# Patient Record
Sex: Female | Born: 1950 | Race: White | Hispanic: No | Marital: Married | Smoking: Never smoker
Health system: Southern US, Community
[De-identification: ages and names within clinical notes are randomized; demographics above are authoritative.]

## PROBLEM LIST (undated history)

## (undated) DIAGNOSIS — I10 Essential (primary) hypertension: Secondary | ICD-10-CM

## (undated) DIAGNOSIS — F419 Anxiety disorder, unspecified: Secondary | ICD-10-CM

## (undated) DIAGNOSIS — E785 Hyperlipidemia, unspecified: Secondary | ICD-10-CM

## (undated) DIAGNOSIS — E119 Type 2 diabetes mellitus without complications: Secondary | ICD-10-CM

## (undated) HISTORY — PX: ABDOMINAL HYSTERECTOMY: SHX81

## (undated) HISTORY — PX: DILATION AND CURETTAGE OF UTERUS: SHX78

## (undated) HISTORY — PX: COLONOSCOPY: SHX174

---

## 2004-06-11 ENCOUNTER — Ambulatory Visit: Payer: Self-pay | Admitting: Family Medicine

## 2005-07-28 ENCOUNTER — Ambulatory Visit: Payer: Self-pay | Admitting: Family Medicine

## 2005-08-03 ENCOUNTER — Ambulatory Visit: Payer: Self-pay | Admitting: Family Medicine

## 2006-10-15 ENCOUNTER — Ambulatory Visit: Payer: Self-pay | Admitting: Gastroenterology

## 2007-02-03 ENCOUNTER — Ambulatory Visit: Payer: Self-pay | Admitting: Family Medicine

## 2008-02-07 ENCOUNTER — Ambulatory Visit: Payer: Self-pay | Admitting: Family Medicine

## 2009-01-02 ENCOUNTER — Ambulatory Visit: Payer: Self-pay | Admitting: Family Medicine

## 2009-02-13 ENCOUNTER — Ambulatory Visit: Payer: Self-pay | Admitting: Family Medicine

## 2011-04-08 ENCOUNTER — Ambulatory Visit: Payer: Self-pay | Admitting: Family Medicine

## 2012-04-13 ENCOUNTER — Ambulatory Visit: Payer: Self-pay | Admitting: Family Medicine

## 2012-05-02 ENCOUNTER — Ambulatory Visit: Payer: Self-pay | Admitting: Family Medicine

## 2013-08-22 ENCOUNTER — Ambulatory Visit: Payer: Self-pay | Admitting: Family Medicine

## 2014-11-08 ENCOUNTER — Other Ambulatory Visit: Payer: Self-pay | Admitting: Family Medicine

## 2014-11-08 DIAGNOSIS — Z1231 Encounter for screening mammogram for malignant neoplasm of breast: Secondary | ICD-10-CM

## 2014-11-14 ENCOUNTER — Ambulatory Visit
Admission: RE | Admit: 2014-11-14 | Discharge: 2014-11-14 | Disposition: A | Discharge status: Home / Self Care | Payer: BC Managed Care – PPO | Source: Ambulatory Visit | Attending: Family Medicine | Admitting: Family Medicine

## 2014-11-14 DIAGNOSIS — Z1231 Encounter for screening mammogram for malignant neoplasm of breast: Secondary | ICD-10-CM

## 2015-12-12 ENCOUNTER — Other Ambulatory Visit: Payer: Self-pay | Admitting: Family Medicine

## 2015-12-12 DIAGNOSIS — Z1239 Encounter for other screening for malignant neoplasm of breast: Secondary | ICD-10-CM

## 2016-01-02 ENCOUNTER — Ambulatory Visit: Payer: BC Managed Care – PPO

## 2016-01-14 ENCOUNTER — Ambulatory Visit: Payer: BC Managed Care – PPO

## 2016-02-11 ENCOUNTER — Ambulatory Visit
Admission: RE | Admit: 2016-02-11 | Discharge: 2016-02-11 | Disposition: A | Discharge status: Home / Self Care | Payer: BC Managed Care – PPO | Source: Ambulatory Visit | Attending: Family Medicine | Admitting: Family Medicine

## 2016-02-11 ENCOUNTER — Other Ambulatory Visit: Payer: Self-pay | Admitting: Family Medicine

## 2016-02-11 DIAGNOSIS — Z1231 Encounter for screening mammogram for malignant neoplasm of breast: Secondary | ICD-10-CM | POA: Insufficient documentation

## 2016-02-11 DIAGNOSIS — Z1239 Encounter for other screening for malignant neoplasm of breast: Secondary | ICD-10-CM

## 2017-01-13 ENCOUNTER — Other Ambulatory Visit: Payer: Self-pay | Admitting: Family Medicine

## 2017-01-13 DIAGNOSIS — Z1231 Encounter for screening mammogram for malignant neoplasm of breast: Secondary | ICD-10-CM

## 2017-02-16 ENCOUNTER — Ambulatory Visit
Admission: RE | Admit: 2017-02-16 | Discharge: 2017-02-16 | Disposition: A | Discharge status: Home / Self Care | Payer: BC Managed Care – PPO | Source: Ambulatory Visit | Attending: Family Medicine | Admitting: Family Medicine

## 2017-02-16 DIAGNOSIS — Z1231 Encounter for screening mammogram for malignant neoplasm of breast: Secondary | ICD-10-CM | POA: Diagnosis not present

## 2018-02-16 ENCOUNTER — Other Ambulatory Visit: Payer: Self-pay | Admitting: Family Medicine

## 2018-02-16 DIAGNOSIS — Z1231 Encounter for screening mammogram for malignant neoplasm of breast: Secondary | ICD-10-CM

## 2018-02-22 ENCOUNTER — Ambulatory Visit
Admission: RE | Admit: 2018-02-22 | Discharge: 2018-02-22 | Disposition: A | Discharge status: Home / Self Care | Payer: Medicare Other | Source: Ambulatory Visit | Attending: Family Medicine | Admitting: Family Medicine

## 2018-02-22 DIAGNOSIS — Z1231 Encounter for screening mammogram for malignant neoplasm of breast: Secondary | ICD-10-CM

## 2019-04-03 ENCOUNTER — Other Ambulatory Visit: Payer: Self-pay | Admitting: Family Medicine

## 2019-04-03 DIAGNOSIS — Z1231 Encounter for screening mammogram for malignant neoplasm of breast: Secondary | ICD-10-CM

## 2019-09-19 ENCOUNTER — Other Ambulatory Visit
Admission: RE | Admit: 2019-09-19 | Discharge: 2019-09-19 | Disposition: A | Discharge status: Home / Self Care | Payer: Medicare PPO | Source: Ambulatory Visit | Attending: Internal Medicine | Admitting: Internal Medicine

## 2019-09-19 DIAGNOSIS — Z20822 Contact with and (suspected) exposure to covid-19: Secondary | ICD-10-CM | POA: Insufficient documentation

## 2019-09-19 DIAGNOSIS — Z01812 Encounter for preprocedural laboratory examination: Secondary | ICD-10-CM | POA: Diagnosis present

## 2019-09-19 LAB — SARS CORONAVIRUS 2 (TAT 6-24 HRS): SARS Coronavirus 2: NEGATIVE

## 2019-09-20 ENCOUNTER — Encounter: Payer: Self-pay | Admitting: Internal Medicine

## 2019-09-21 ENCOUNTER — Ambulatory Visit
Admission: RE | Admit: 2019-09-21 | Discharge: 2019-09-21 | Disposition: A | Discharge status: Home / Self Care | Payer: Medicare PPO | Attending: Internal Medicine | Admitting: Internal Medicine

## 2019-09-21 ENCOUNTER — Encounter
Admission: RE | Disposition: A | Discharge status: Home / Self Care | Payer: Self-pay | Source: Home / Self Care | Attending: Internal Medicine

## 2019-09-21 ENCOUNTER — Ambulatory Visit: Payer: Medicare PPO | Admitting: Anesthesiology

## 2019-09-21 ENCOUNTER — Encounter: Payer: Self-pay | Admitting: Internal Medicine

## 2019-09-21 ENCOUNTER — Other Ambulatory Visit: Payer: Self-pay

## 2019-09-21 DIAGNOSIS — Z79899 Other long term (current) drug therapy: Secondary | ICD-10-CM | POA: Diagnosis not present

## 2019-09-21 DIAGNOSIS — Z1211 Encounter for screening for malignant neoplasm of colon: Secondary | ICD-10-CM | POA: Insufficient documentation

## 2019-09-21 DIAGNOSIS — E785 Hyperlipidemia, unspecified: Secondary | ICD-10-CM | POA: Diagnosis not present

## 2019-09-21 DIAGNOSIS — I10 Essential (primary) hypertension: Secondary | ICD-10-CM | POA: Insufficient documentation

## 2019-09-21 DIAGNOSIS — Z800 Family history of malignant neoplasm of digestive organs: Secondary | ICD-10-CM | POA: Insufficient documentation

## 2019-09-21 DIAGNOSIS — F419 Anxiety disorder, unspecified: Secondary | ICD-10-CM | POA: Insufficient documentation

## 2019-09-21 DIAGNOSIS — E119 Type 2 diabetes mellitus without complications: Secondary | ICD-10-CM | POA: Insufficient documentation

## 2019-09-21 DIAGNOSIS — K5730 Diverticulosis of large intestine without perforation or abscess without bleeding: Secondary | ICD-10-CM | POA: Diagnosis not present

## 2019-09-21 DIAGNOSIS — Z7984 Long term (current) use of oral hypoglycemic drugs: Secondary | ICD-10-CM | POA: Insufficient documentation

## 2019-09-21 DIAGNOSIS — Z888 Allergy status to other drugs, medicaments and biological substances status: Secondary | ICD-10-CM | POA: Insufficient documentation

## 2019-09-21 DIAGNOSIS — Z7982 Long term (current) use of aspirin: Secondary | ICD-10-CM | POA: Diagnosis not present

## 2019-09-21 HISTORY — DX: Essential (primary) hypertension: I10

## 2019-09-21 HISTORY — PX: COLONOSCOPY WITH PROPOFOL: SHX5780

## 2019-09-21 HISTORY — DX: Type 2 diabetes mellitus without complications: E11.9

## 2019-09-21 HISTORY — DX: Hyperlipidemia, unspecified: E78.5

## 2019-09-21 HISTORY — DX: Anxiety disorder, unspecified: F41.9

## 2019-09-21 LAB — GLUCOSE, CAPILLARY: Glucose-Capillary: 132 mg/dL — ABNORMAL HIGH (ref 70–99)

## 2019-09-21 SURGERY — COLONOSCOPY WITH PROPOFOL
Anesthesia: General

## 2019-09-21 MED ORDER — LIDOCAINE HCL (CARDIAC) PF 100 MG/5ML IV SOSY
PREFILLED_SYRINGE | INTRAVENOUS | Status: DC | PRN
  Administered 2019-09-21: 40 mg via INTRAVENOUS

## 2019-09-21 MED ORDER — MIDAZOLAM HCL 2 MG/2ML IJ SOLN
INTRAMUSCULAR | Status: AC
  Filled 2019-09-21: qty 2

## 2019-09-21 MED ORDER — PROPOFOL 500 MG/50ML IV EMUL
INTRAVENOUS | Status: DC | PRN
  Administered 2019-09-21: 150 ug/kg/min via INTRAVENOUS

## 2019-09-21 MED ORDER — MIDAZOLAM HCL 2 MG/2ML IJ SOLN
INTRAMUSCULAR | Status: DC | PRN
  Administered 2019-09-21: 2 mg via INTRAVENOUS

## 2019-09-21 MED ORDER — EPHEDRINE 5 MG/ML INJ
INTRAVENOUS | Status: AC
  Filled 2019-09-21: qty 10

## 2019-09-21 MED ORDER — PROPOFOL 10 MG/ML IV BOLUS
INTRAVENOUS | Status: DC | PRN
  Administered 2019-09-21: 80 mg via INTRAVENOUS
  Administered 2019-09-21: 40 mg via INTRAVENOUS

## 2019-09-21 MED ORDER — EPHEDRINE SULFATE 50 MG/ML IJ SOLN
INTRAMUSCULAR | Status: DC | PRN
  Administered 2019-09-21: 10 mg via INTRAVENOUS

## 2019-09-21 MED ORDER — PROPOFOL 500 MG/50ML IV EMUL
INTRAVENOUS | Status: AC
  Filled 2019-09-21: qty 50

## 2019-09-21 MED ORDER — SODIUM CHLORIDE 0.9 % IV SOLN: INTRAVENOUS | Status: DC

## 2019-09-21 NOTE — Anesthesia Preprocedure Evaluation (Signed)
Anesthesia Evaluation  Patient identified by MRN, date of birth, ID band Patient awake    Reviewed: Allergy & Precautions, NPO status , Patient's Chart, lab work & pertinent test results  Airway Mallampati: III       Dental   Pulmonary neg pulmonary ROS,           Cardiovascular hypertension,      Neuro/Psych Anxiety negative neurological ROS     GI/Hepatic Neg liver ROS,   Endo/Other  diabetes  Renal/GU negative Renal ROS  negative genitourinary   Musculoskeletal negative musculoskeletal ROS (+)   Abdominal   Peds negative pediatric ROS (+)  Hematology negative hematology ROS (+)   Anesthesia Other Findings Past Medical History: No date: Anxiety No date: Diabetes mellitus without complication (HCC) No date: Hyperlipidemia No date: Hypertension  Reproductive/Obstetrics                             Anesthesia Physical Anesthesia Plan  ASA: II  Anesthesia Plan: General   Post-op Pain Management:    Induction: Intravenous  PONV Risk Score and Plan: Propofol infusion  Airway Management Planned: Nasal Cannula  Additional Equipment:   Intra-op Plan:   Post-operative Plan:   Informed Consent: I have reviewed the patients History and Physical, chart, labs and discussed the procedure including the risks, benefits and alternatives for the proposed anesthesia with the patient or authorized representative who has indicated his/her understanding and acceptance.     Dental advisory given  Plan Discussed with: CRNA and Surgeon  Anesthesia Plan Comments:         Anesthesia Quick Evaluation

## 2019-09-21 NOTE — Anesthesia Procedure Notes (Signed)
Performed by: Alencia Gordon, CRNA Pre-anesthesia Checklist: Patient identified, Emergency Drugs available, Suction available and Patient being monitored Patient Re-evaluated:Patient Re-evaluated prior to induction Oxygen Delivery Method: Nasal cannula Induction Type: IV induction Dental Injury: Teeth and Oropharynx as per pre-operative assessment  Comments: Nasal cannula with etCO2 monitoring       

## 2019-09-21 NOTE — Transfer of Care (Signed)
Immediate Anesthesia Transfer of Care Note  Patient: Victoria Bauer  Procedure(s) Performed: Procedure(s): COLONOSCOPY WITH PROPOFOL (N/A)  Patient Location: PACU and Endoscopy Unit  Anesthesia Type:General  Level of Consciousness: sedated  Airway & Oxygen Therapy: Patient Spontanous Breathing and Patient connected to nasal cannula oxygen  Post-op Assessment: Report given to RN and Post -op Vital signs reviewed and stable  Post vital signs: Reviewed and stable  Last Vitals:  Vitals:   09/21/19 1000 09/21/19 1007  BP:  (!) 89/47  Pulse:  65  Resp:  (!) 22  Temp: (!) (P) 35.8 C   SpO2:  96%    Complications: No apparent anesthesia complications

## 2019-09-21 NOTE — Op Note (Signed)
[facility] Patient Name: Victoria Bauer Procedure Date: 09/21/2019 9:35 AM MRN: [id number] Account #: [id number] Date of Birth: 1950/07/06 Admit Type: Outpatient Age: 69 Room: [facility] ENDO ROOM 3 Gender: Female Note Status: Finalized Procedure:             Colonoscopy Indications:           Screening patient at increased risk: Family history of                         1st-degree relative with colorectal cancer at age 25                         years (or older) Providers:             Binnie Vonderhaar K. Norma Fredrickson MD, MD Referring MD:          Teena Irani. Terance Hart, MD (Referring MD) Medicines:             Propofol per Anesthesia Complications:         No immediate complications. Procedure:             Pre-Anesthesia Assessment:                        - The risks and benefits of the procedure and the                         sedation options and risks were discussed with the                         patient. All questions were answered and informed                         consent was obtained.                        - Patient identification and proposed procedure were                         verified prior to the procedure by the nurse. The                         procedure was verified in the procedure room.                        - ASA Grade Assessment: III - A patient with severe                         systemic disease.                        - After reviewing the risks and benefits, the patient                         was deemed in satisfactory condition to undergo the                         procedure.                        After obtaining informed consent,  the colonoscope was                         passed under direct vision. Throughout the procedure,                         the patient's blood pressure, pulse, and oxygen                         saturations were monitored continuously. The                         Colonoscope was introduced through the anus and                          advanced to the the cecum, identified by appendiceal                         orifice and ileocecal valve. The colonoscopy was                         performed without difficulty. The patient tolerated                         the procedure well. The quality of the bowel                         preparation was good. Findings:      The perianal and digital rectal examinations were normal. Pertinent       negatives include normal sphincter tone and no palpable rectal lesions.      A few small-mouthed diverticula were found in the sigmoid colon.      The exam was otherwise without abnormality. Impression:            - Diverticulosis in the sigmoid colon.                        - The examination was otherwise normal.                        - No specimens collected. Recommendation:        - Patient has a contact number available for                         emergencies. The signs and symptoms of potential                         delayed complications were discussed with the patient.                         Return to normal activities tomorrow. Written                         discharge instructions were provided to the patient.                        - Resume previous diet.                        - Continue  present medications.                        - Repeat colonoscopy in 5 years for screening purposes.                        - Return to GI office PRN. Procedure Code(s):     --- Professional ---                        U1324, Colorectal cancer screening; colonoscopy on                         individual at high risk Diagnosis Code(s):     --- Professional ---                        K57.30, Diverticulosis of large intestine without                         perforation or abscess without bleeding                        Z80.0, Family history of malignant neoplasm of                         digestive organs CPT copyright 2019 American Medical Association. All rights  reserved. The codes documented in this report are preliminary and upon coder review may  be revised to meet current compliance requirements. Stanton Kidney MD, MD 09/21/2019 10:00:53 AM This report has been signed electronically. Number of Addenda: 0 Note Initiated On: 09/21/2019 9:35 AM Scope Withdrawal Time: 0 hours 6 minutes 28 seconds  Total Procedure Duration: 0 hours 11 minutes 3 seconds  Estimated Blood Loss:  Estimated blood loss: none.      [facility]

## 2019-09-21 NOTE — H&P (Signed)
Outpatient short stay form Pre-procedure 09/21/2019 9:33 AM Ordell Prichett K. Norma Fredrickson, M.D.  Primary Physician: Dorothey Baseman, M.D.  Reason for visit:  Family history of colon cancer  History of present illness:  69 y/o female presents for a family history of colon cancer. Patient denies change in bowel habits, rectal bleeding, weight loss or abdominal pain.      Current Facility-Administered Medications:  .  0.9 %  sodium chloride infusion, , Intravenous, Continuous, [place], Boykin Nearing, MD, Last Rate: 20 mL/hr at 09/21/19 0842, New Bag at 09/21/19 0842  Medications Prior to Admission  Medication Sig Dispense Refill Last Dose  . amLODipine-benazepril (LOTREL) 5-20 MG capsule Take 1 capsule by mouth 2 (two) times daily.   09/21/2019 at 0600  . aspirin EC 81 MG tablet Take 81 mg by mouth daily.   Past Week at Unknown time  . diazepam (VALIUM) 5 MG tablet Take 5 mg by mouth every 6 (six) hours as needed for anxiety.   Past Week at Unknown time  . Dulaglutide 0.75 MG/0.5ML SOPN Inject into the skin. Inject subcutaneously weekly   Past Week at Unknown time  . ezetimibe-simvastatin (VYTORIN) 10-40 MG tablet Take 1 tablet by mouth at bedtime.   Past Week at Unknown time  . fexofenadine (ALLEGRA) 180 MG tablet Take 180 mg by mouth daily.   Past Week at Unknown time  . fluticasone (FLONASE) 50 MCG/ACT nasal spray Place into both nostrils daily.   Past Week at Unknown time  . glimepiride (AMARYL) 2 MG tablet Take 2 mg by mouth daily with breakfast.   Past Week at Unknown time  . hydrochlorothiazide (HYDRODIURIL) 25 MG tablet Take 25 mg by mouth daily.   09/20/2019 at 0800  . linagliptin (TRADJENTA) 5 MG TABS tablet Take 5 mg by mouth daily.   Past Week at Unknown time  . metFORMIN (GLUCOPHAGE) 500 MG tablet Take by mouth 2 (two) times daily with a meal.   Past Week at Unknown time  . Multiple Vitamin (MULTIVITAMIN) tablet Take 1 tablet by mouth daily.   Past Week at Unknown time  . psyllium (REGULOID) 0.52  g capsule Take 0.52 g by mouth daily.   Past Week at Unknown time     Allergies  Allergen Reactions  . Beta Adrenergic Blockers      Past Medical History:  Diagnosis Date  . Anxiety   . Diabetes mellitus without complication (HCC)   . Hyperlipidemia   . Hypertension     Review of systems:  Otherwise negative.    Physical Exam  Gen: Alert, oriented. Appears stated age.  HEENT: [place]/AT. PERRLA. Lungs: CTA, no wheezes. CV: RR nl S1, S2. Abd: soft, benign, no masses. BS+ Ext: No edema. Pulses 2+    Planned procedures: Proceed with colonoscopy. The patient understands the nature of the planned procedure, indications, risks, alternatives and potential complications including but not limited to bleeding, infection, perforation, damage to internal organs and possible oversedation/side effects from anesthesia. The patient agrees and gives consent to proceed.  Please refer to procedure notes for findings, recommendations and patient disposition/instructions.     Shylo Zamor K. Norma Fredrickson, M.D. Gastroenterology 09/21/2019  9:33 AM

## 2019-09-21 NOTE — Interval H&P Note (Signed)
History and Physical Interval Note:  09/21/2019 9:34 AM  Victoria Bauer  has presented today for surgery, with the diagnosis of fhcc change in bowels.  The various methods of treatment have been discussed with the patient and family. After consideration of risks, benefits and other options for treatment, the patient has consented to  Procedure(s): COLONOSCOPY WITH PROPOFOL (N/A) as a surgical intervention.  The patient's history has been reviewed, patient examined, no change in status, stable for surgery.  I have reviewed the patient's chart and labs.  Questions were answered to the patient's satisfaction.     [name], [name]

## 2019-09-21 NOTE — Anesthesia Postprocedure Evaluation (Signed)
Anesthesia Post Note  Patient: Victoria Bauer  Procedure(s) Performed: COLONOSCOPY WITH PROPOFOL (N/A )  Patient location during evaluation: Endoscopy Anesthesia Type: General Level of consciousness: awake and alert and oriented Pain management: pain level controlled Vital Signs Assessment: post-procedure vital signs reviewed and stable Respiratory status: spontaneous breathing Cardiovascular status: blood pressure returned to baseline Anesthetic complications: no     Last Vitals:  Vitals:   09/21/19 1050 09/21/19 1100  BP: (!) 105/50 (!) 103/51  Pulse:    Resp:    Temp:    SpO2:      Last Pain:  Vitals:   09/21/19 1028  TempSrc:   PainSc: 0-No pain                 Artie Takayama

## 2019-09-21 NOTE — OR Nursing (Signed)
Systolic bp unstable. Trending in 90s. rn spoke with dr Noralyn Pick who ordered 250cc bolus of ns now.

## 2019-09-22 ENCOUNTER — Encounter: Payer: Self-pay | Admitting: *Deleted

## 2019-09-25 ENCOUNTER — Ambulatory Visit
Admission: RE | Admit: 2019-09-25 | Discharge: 2019-09-25 | Disposition: A | Discharge status: Home / Self Care | Payer: Medicare PPO | Source: Ambulatory Visit | Attending: Family Medicine | Admitting: Family Medicine

## 2019-09-25 DIAGNOSIS — Z1231 Encounter for screening mammogram for malignant neoplasm of breast: Secondary | ICD-10-CM | POA: Diagnosis not present

## 2021-07-12 IMAGING — MG DIGITAL SCREENING BILAT W/ TOMO W/ CAD
6 of 10 series · 6 of 30 positions shown · non-contrast
Comparison: Previous exam(s).

CLINICAL DATA: Screening.

EXAM:
DIGITAL SCREENING BILATERAL MAMMOGRAM WITH TOMO AND CAD

[R MLO synth-2D (1 of 2)]
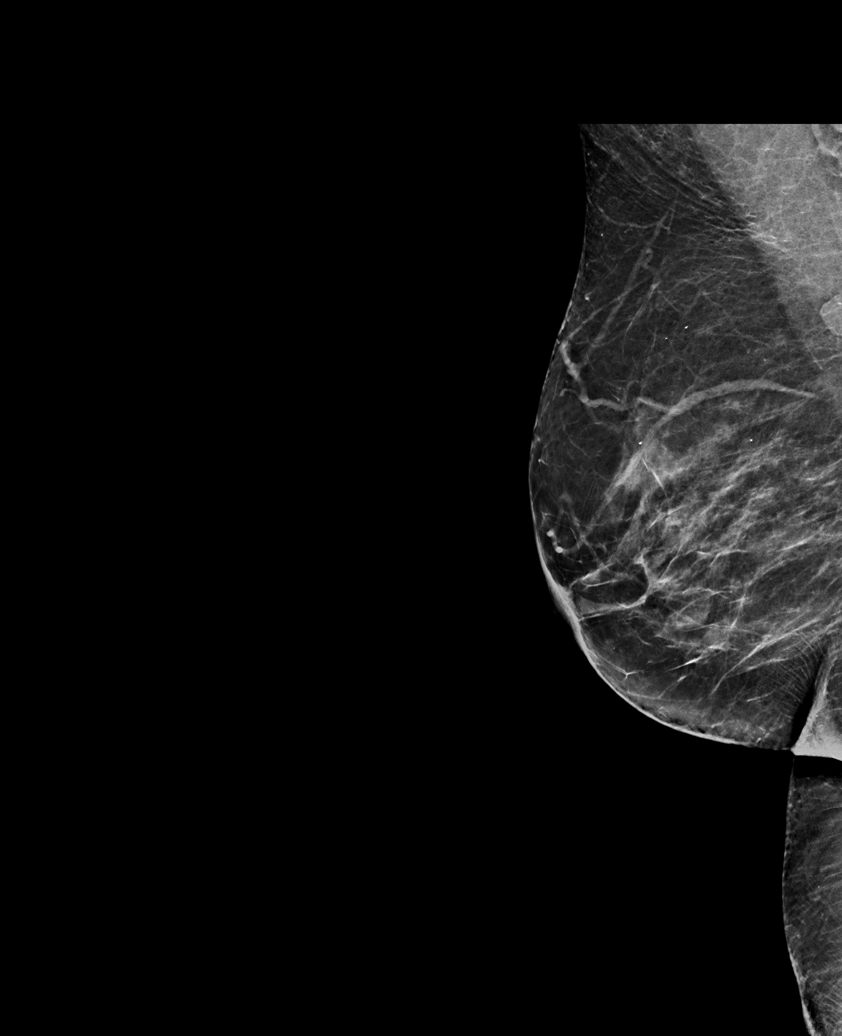

[R MLO synth-2D (2 of 2)]
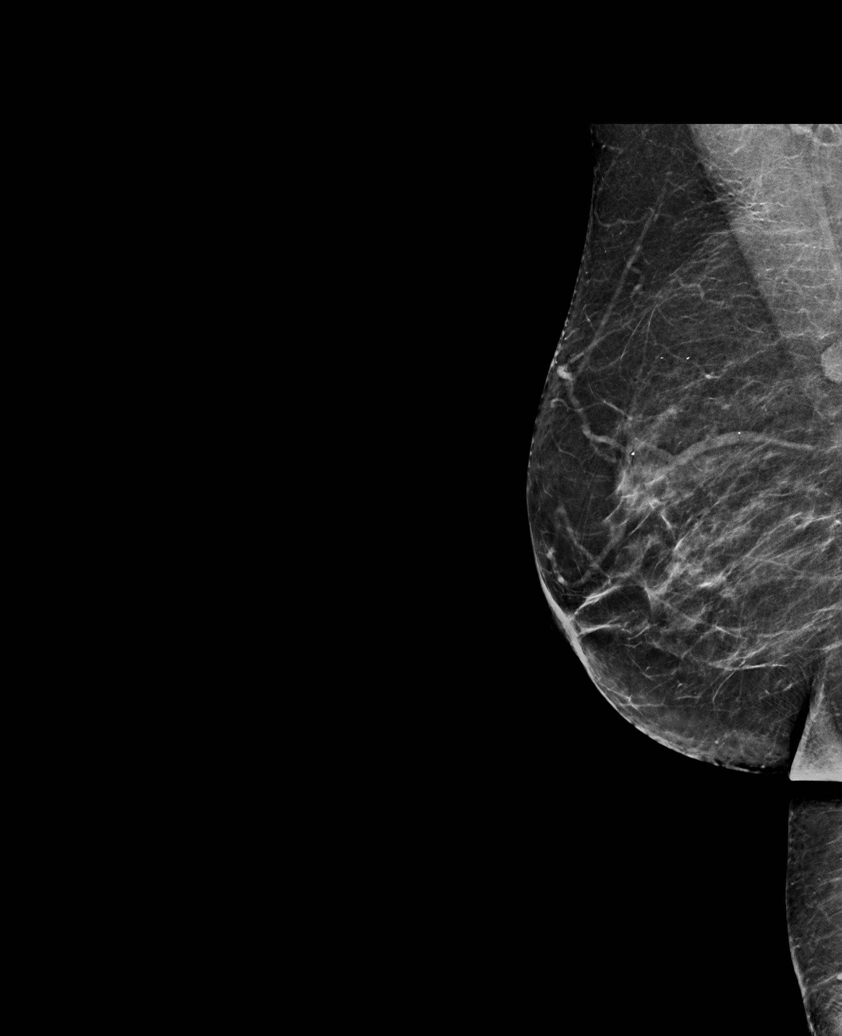

[L MLO synth-2D]
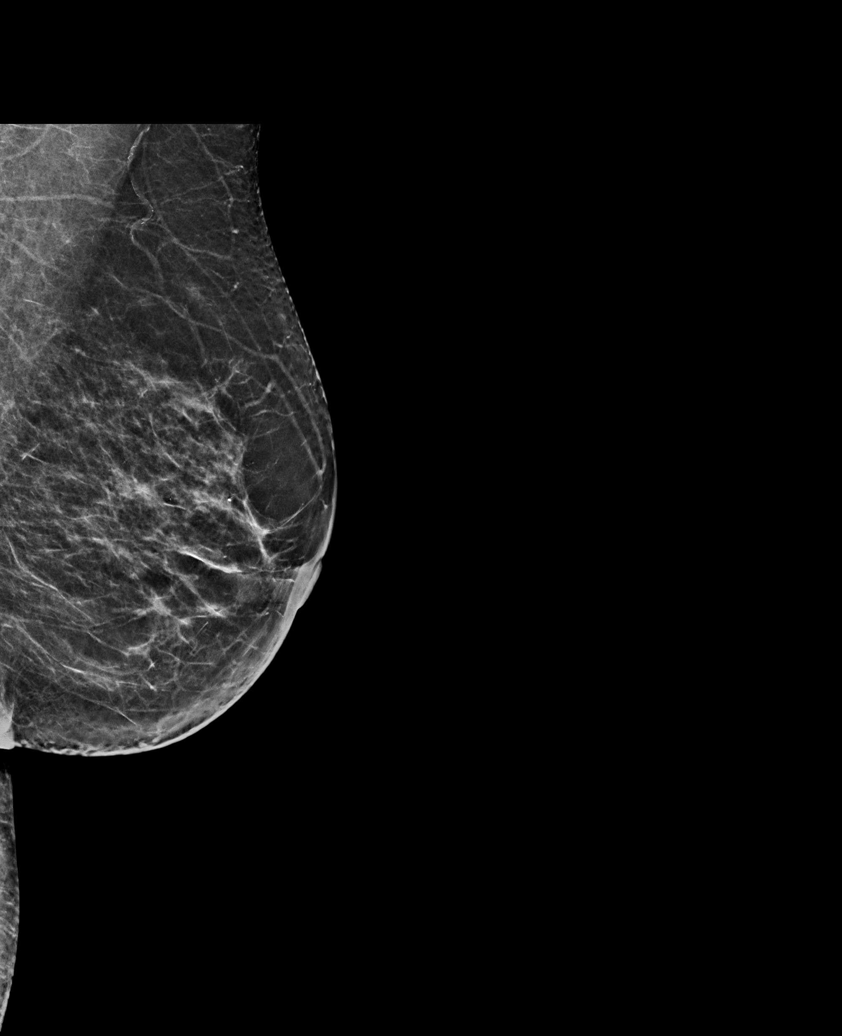

[R CC synth-2D]
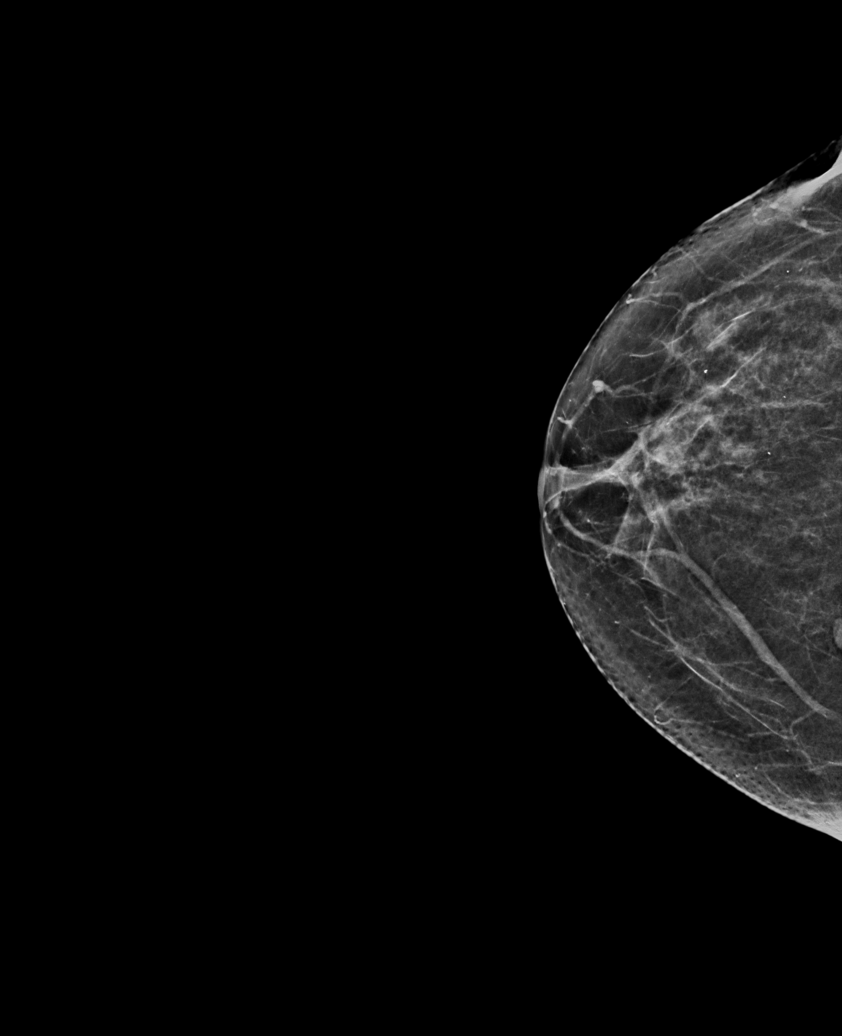

[L CC synth-2D]
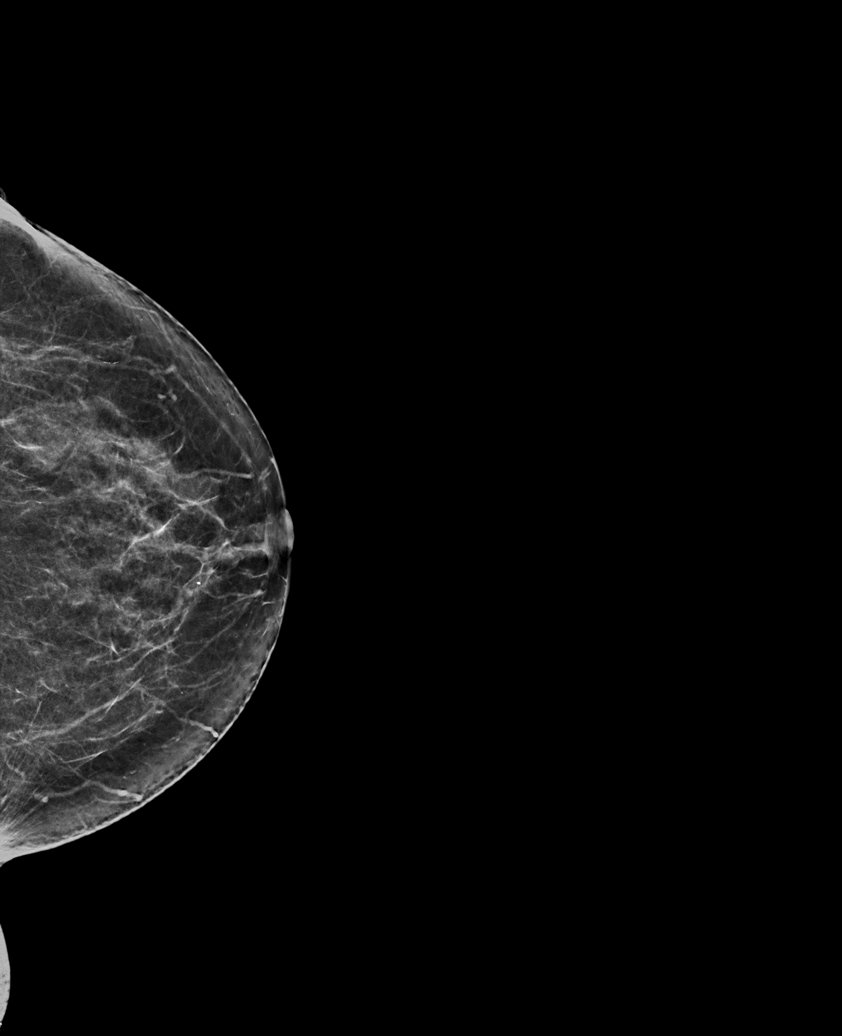

[R CC tomo · tomo slice 31/60.0]
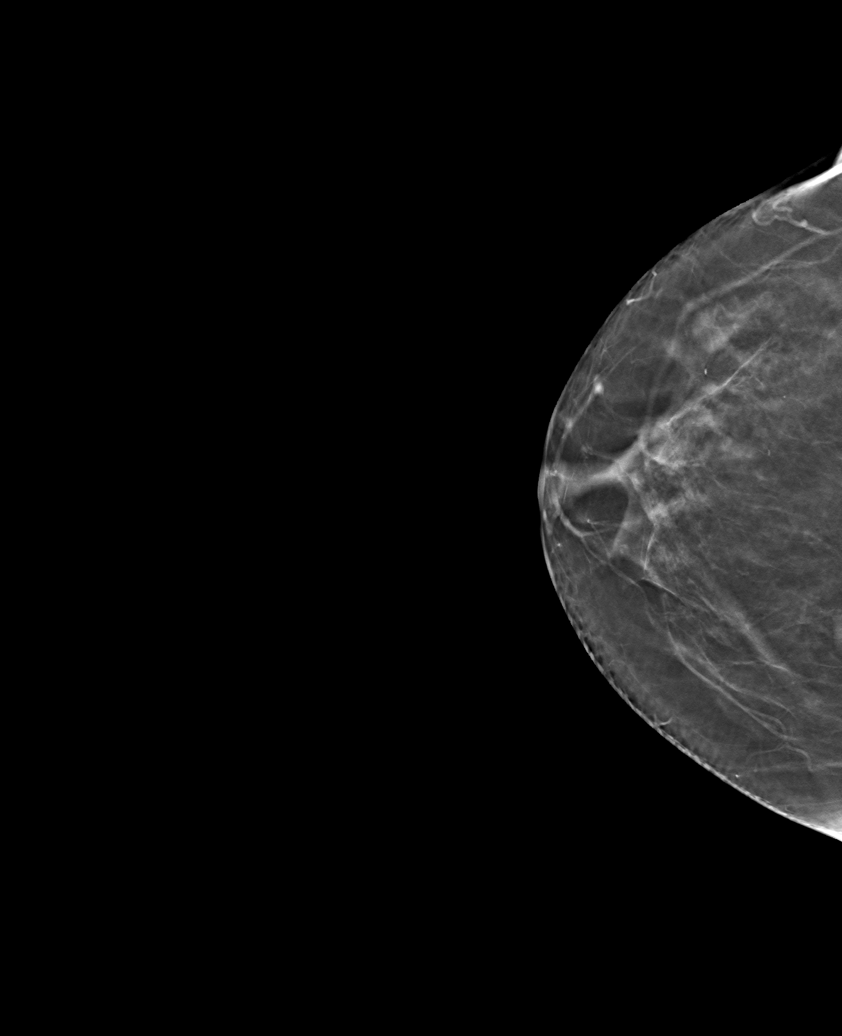

[6 of 30 positions shown; findings below may reference images not displayed]

ACR Breast Density Category b: There are scattered areas of
fibroglandular density.
FINDINGS: There are no findings suspicious for malignancy. Images were
processed with CAD.
IMPRESSION: No mammographic evidence of malignancy. A result letter of this
screening mammogram will be mailed directly to the patient.

RECOMMENDATION:
Screening mammogram in one year. (Code:[id number])

BI-RADS CATEGORY  1: Negative.

## 2021-09-16 ENCOUNTER — Other Ambulatory Visit: Payer: Self-pay | Admitting: Family Medicine

## 2021-09-16 DIAGNOSIS — Z1231 Encounter for screening mammogram for malignant neoplasm of breast: Secondary | ICD-10-CM

## 2021-09-17 ENCOUNTER — Ambulatory Visit
Admission: RE | Admit: 2021-09-17 | Discharge: 2021-09-17 | Disposition: A | Discharge status: Home / Self Care | Payer: Medicare PPO | Source: Ambulatory Visit | Attending: Family Medicine | Admitting: Family Medicine

## 2021-09-17 DIAGNOSIS — Z1231 Encounter for screening mammogram for malignant neoplasm of breast: Secondary | ICD-10-CM | POA: Diagnosis present

## 2022-03-20 ENCOUNTER — Other Ambulatory Visit: Payer: Self-pay

## 2022-03-20 ENCOUNTER — Emergency Department: Payer: Medicare PPO

## 2022-03-20 DIAGNOSIS — M7918 Myalgia, other site: Secondary | ICD-10-CM | POA: Diagnosis not present

## 2022-03-20 DIAGNOSIS — R6883 Chills (without fever): Secondary | ICD-10-CM | POA: Diagnosis not present

## 2022-03-20 DIAGNOSIS — E119 Type 2 diabetes mellitus without complications: Secondary | ICD-10-CM | POA: Insufficient documentation

## 2022-03-20 DIAGNOSIS — I10 Essential (primary) hypertension: Secondary | ICD-10-CM | POA: Diagnosis not present

## 2022-03-20 DIAGNOSIS — R519 Headache, unspecified: Secondary | ICD-10-CM | POA: Diagnosis present

## 2022-03-20 DIAGNOSIS — Z20822 Contact with and (suspected) exposure to covid-19: Secondary | ICD-10-CM | POA: Diagnosis not present

## 2022-03-20 LAB — CBC WITH DIFFERENTIAL/PLATELET
Abs Immature Granulocytes: 0.07 10*3/uL (ref 0.00–0.07)
Basophils Absolute: 0 10*3/uL (ref 0.0–0.1)
Basophils Relative: 0 %
Eosinophils Absolute: 0.1 10*3/uL (ref 0.0–0.5)
Eosinophils Relative: 1 %
HCT: 38.5 % (ref 36.0–46.0)
Hemoglobin: 12.4 g/dL (ref 12.0–15.0)
Immature Granulocytes: 1 %
Lymphocytes Relative: 16 %
Lymphs Abs: 2 10*3/uL (ref 0.7–4.0)
MCH: 25.7 pg — ABNORMAL LOW (ref 26.0–34.0)
MCHC: 32.2 g/dL (ref 30.0–36.0)
MCV: 79.7 fL — ABNORMAL LOW (ref 80.0–100.0)
Monocytes Absolute: 0.9 10*3/uL (ref 0.1–1.0)
Monocytes Relative: 7 %
Neutro Abs: 9.2 10*3/uL — ABNORMAL HIGH (ref 1.7–7.7)
Neutrophils Relative %: 75 %
Platelets: 286 10*3/uL (ref 150–400)
RBC: 4.83 MIL/uL (ref 3.87–5.11)
RDW: 14.3 % (ref 11.5–15.5)
WBC: 12.4 10*3/uL — ABNORMAL HIGH (ref 4.0–10.5)
nRBC: 0 % (ref 0.0–0.2)

## 2022-03-20 LAB — COMPREHENSIVE METABOLIC PANEL
ALT: 21 U/L (ref 0–44)
AST: 23 U/L (ref 15–41)
Albumin: 4.4 g/dL (ref 3.5–5.0)
Alkaline Phosphatase: 68 U/L (ref 38–126)
Anion gap: 10 (ref 5–15)
BUN: 23 mg/dL (ref 8–23)
CO2: 23 mmol/L (ref 22–32)
Calcium: 9.6 mg/dL (ref 8.9–10.3)
Chloride: 101 mmol/L (ref 98–111)
Creatinine, Ser: 0.95 mg/dL (ref 0.44–1.00)
GFR, Estimated: >60 mL/min (ref >60)
Glucose, Bld: 162 mg/dL — ABNORMAL HIGH (ref 70–99)
Potassium: 4.2 mmol/L (ref 3.5–5.1)
Sodium: 134 mmol/L — ABNORMAL LOW (ref 135–145)
Total Bilirubin: 0.9 mg/dL (ref 0.3–1.2)
Total Protein: 7.7 g/dL (ref 6.5–8.1)

## 2022-03-20 MED ORDER — ONDANSETRON 4 MG PO TBDP
8.0000 mg | ORAL_TABLET | Freq: Once | ORAL | Status: AC
  Administered 2022-03-20: 8 mg via ORAL
  Filled 2022-03-20: qty 2

## 2022-03-20 NOTE — ED Triage Notes (Signed)
H/a with HTN and dizziness. Acute onset 1900. Denies vision change. Reports numbness in R hand earlier. Pt alert and oriented following commands. Denies chest pain or pressure. Denies n/v. Pt breathing unlabored speaking in full sentences.

## 2022-03-20 NOTE — ED Provider Triage Note (Signed)
Emergency Medicine Provider Triage Evaluation Note  Victoria Bauer , a 71 y.o. female  was evaluated in triage.  Pt complains of headache.  Patient presents the emergency department with global headache.  Sudden onset around 7:00.  Patient has a history of ocular migraines but has only had 1 other true migraine in the past.  Patient states that it feels exactly like the other migraine that she had.  No recent trauma.  No other symptoms.  No unilateral weakness, facial droop, slurred speech.  Acting at baseline according to the husband who is present with the patient at this time..  Review of Systems  Positive: Sharp headache Negative: Vision changes, unilateral weakness, facial droop, slurred speech, URI symptoms, fevers or chills.  Physical Exam  BP (!) 199/74 (BP Location: Left Arm)   Pulse (!) 108   Temp 98.1 F (36.7 C) (Oral)   Resp 20   Ht 5\' 3"  (1.6 m)   Wt 70.3 kg   SpO2 97%   BMI 27.46 kg/m  Gen:   Awake, no distress   Resp:  Normal effort  MSK:   Moves extremities without difficulty no Other:  Cranial nerves II through XII grossly intact.  Negative Romberg and pronator drift.  Medical Decision Making  Medically screening exam initiated at 11:04 PM.  Appropriate orders placed.  [name] was informed that the remainder of the evaluation will be completed by another provider, this initial triage assessment does not replace that evaluation, and the importance of remaining in the ED until their evaluation is complete.  Presents with sudden onset HA. No other associated symptoms. Remote HX of migraines and has ocular migraines. CT and labs ordered. After triage patient developed nausea and vomiting and zofran was ordered.   Darletta Moll, PA-C 03/20/22 2304

## 2022-03-21 ENCOUNTER — Emergency Department
Admission: EM | Admit: 2022-03-21 | Discharge: 2022-03-21 | Disposition: A | Discharge status: Home / Self Care | Payer: Medicare PPO | Attending: Emergency Medicine | Admitting: Emergency Medicine

## 2022-03-21 ENCOUNTER — Encounter: Payer: Self-pay | Admitting: Emergency Medicine

## 2022-03-21 DIAGNOSIS — R519 Headache, unspecified: Secondary | ICD-10-CM

## 2022-03-21 LAB — RESP PANEL BY RT-PCR (FLU A&B, COVID) ARPGX2
Influenza A by PCR: NEGATIVE
Influenza B by PCR: NEGATIVE
SARS Coronavirus 2 by RT PCR: NEGATIVE

## 2022-03-21 MED ORDER — FAMOTIDINE 20 MG PO TABS: 20.0000 mg | ORAL_TABLET | Freq: Two times a day (BID) | ORAL | 0 refills | Status: AC

## 2022-03-21 MED ORDER — DIPHENHYDRAMINE HCL 25 MG PO CAPS: 25.0000 mg | ORAL_CAPSULE | Freq: Four times a day (QID) | ORAL | 0 refills | Status: AC | PRN

## 2022-03-21 MED ORDER — ACETAMINOPHEN 500 MG PO TABS
1000.0000 mg | ORAL_TABLET | Freq: Once | ORAL | Status: AC
  Administered 2022-03-21: 1000 mg via ORAL
  Filled 2022-03-21: qty 2

## 2022-03-21 MED ORDER — METOCLOPRAMIDE HCL 10 MG PO TABS
10.0000 mg | ORAL_TABLET | Freq: Once | ORAL | Status: AC
  Administered 2022-03-21: 10 mg via ORAL
  Filled 2022-03-21: qty 1

## 2022-03-21 MED ORDER — DIPHENHYDRAMINE HCL 25 MG PO CAPS
25.0000 mg | ORAL_CAPSULE | Freq: Once | ORAL | Status: AC
  Administered 2022-03-21: 25 mg via ORAL
  Filled 2022-03-21: qty 1

## 2022-03-21 MED ORDER — KETOROLAC TROMETHAMINE 10 MG PO TABS
10.0000 mg | ORAL_TABLET | Freq: Once | ORAL | Status: AC
  Administered 2022-03-21: 10 mg via ORAL
  Filled 2022-03-21: qty 1

## 2022-03-21 NOTE — ED Provider Notes (Signed)
[facility] Provider Note    Event Date/Time   First MD Initiated Contact with Patient 03/21/22 [phone]     (approximate)   History   Chief Complaint: Headache and Hypertension   HPI  Victoria Bauer is a 71 y.o. female with a past history of anxiety, hyperlipidemia hypertension diabetes and migraines who comes ED complaining of hypertension and dizziness, gradual onset throughout the afternoon.  Bilateral frontal, nonradiating, no vision change.  No neck pain or stiffness, no fever, no trauma.  Also endorses body aches and chills.     Physical Exam   Triage Vital Signs: ED Triage Vitals  Enc Vitals Group     BP 03/20/22 2248 (!) 199/74     Pulse Rate 03/20/22 2248 (!) 108     Resp 03/20/22 2248 20     Temp 03/20/22 2248 98.1 F (36.7 C)     Temp Source 03/20/22 2248 Oral     SpO2 03/20/22 2248 97 %     Weight 03/20/22 2249 155 lb (70.3 kg)     Height 03/20/22 2249 5\' 3"  (1.6 m)     Head Circumference --      Peak Flow --      Pain Score 03/20/22 2249 10     Pain Loc --      Pain Edu? --      Excl. in [place]? --     Most recent vital signs: Vitals:   03/20/22 2248 03/21/22 0410  BP: (!) 199/74 (!) 129/50  Pulse: (!) 108 73  Resp: 20 15  Temp: 98.1 F (36.7 C) 98.4 F (36.9 C)  SpO2: 97% 99%    General: Awake, no distress.  CV:  Good peripheral perfusion.  Regular rate rhythm, heart rate 80 Resp:  Normal effort.  Clear to auscultation bilaterally Abd:  No distention.  Other:  Cranial nerves III through XII intact.  PERRL, EOMI.  No tenderness.  No evidence of trauma.  Moist oral mucosa.  Moving all extremities.   ED Results / Procedures / Treatments   Labs (all labs ordered are listed, but only abnormal results are displayed) Labs Reviewed  CBC WITH DIFFERENTIAL/PLATELET - Abnormal; Notable for the following components:      Result Value   WBC 12.4 (*)    MCV 79.7 (*)    MCH 25.7 (*)    Neutro Abs 9.2 (*)    All other components  within normal limits  COMPREHENSIVE METABOLIC PANEL - Abnormal; Notable for the following components:   Sodium 134 (*)    Glucose, Bld 162 (*)    All other components within normal limits  RESP PANEL BY RT-PCR (FLU A&B, COVID) [id number]     EKG    RADIOLOGY CT head interpreted by me, negative for mass or intracranial hemorrhage.  Radiology report reviewed   PROCEDURES:  Procedures   MEDICATIONS ORDERED IN ED: Medications  ondansetron (ZOFRAN-ODT) disintegrating tablet 8 mg (8 mg Oral Given 03/20/22 2306)  acetaminophen (TYLENOL) tablet 1,000 mg (1,000 mg Oral Given 03/21/22 0151)  metoCLOPramide (REGLAN) tablet 10 mg (10 mg Oral Given 03/21/22 0449)  diphenhydrAMINE (BENADRYL) capsule 25 mg (25 mg Oral Given 03/21/22 0449)  ketorolac (TORADOL) tablet 10 mg (10 mg Oral Given 03/21/22 0449)     IMPRESSION / MDM / ASSESSMENT AND PLAN / ED COURSE  I reviewed the triage vital signs and the nursing notes.  Differential diagnosis includes, but is not limited to, intracranial mass, dehydration, viral syndrome, electrolyte abnormality.  Doubt aneurysm, intracranial hemorrhage, meningitis encephalitis or stroke  Patient's presentation is most consistent with acute presentation with potential threat to life or bodily function.  Patient presents with bilateral frontal headache and constitutional symptoms most likely a viral illness.  COVID and flu are negative.  Labs and CT head are unremarkable, feeling better after supportive care.  Stable for discharge.   Considering the patient's symptoms, medical history, and physical examination today, I have low suspicion for ischemic stroke, intracranial hemorrhage, meningitis, encephalitis, carotid or vertebral dissection, venous sinus thrombosis, MS, intracranial hypertension, glaucoma, CRAO, CRVO, or temporal arteritis.        FINAL CLINICAL IMPRESSION(S) / ED DIAGNOSES   Final diagnoses:  Bad headache      Rx / DC Orders   ED Discharge Orders          Ordered    diphenhydrAMINE (BENADRYL) 25 mg capsule  Every 6 hours PRN        03/21/22 0713    famotidine (PEPCID) 20 MG tablet  2 times daily        03/21/22 2229             Note:  This document was prepared using Dragon voice recognition software and may include unintentional dictation errors.   Carrie Mew, MD 03/21/22 [phone]

## 2022-10-22 ENCOUNTER — Other Ambulatory Visit: Payer: Self-pay | Admitting: Internal Medicine

## 2022-10-22 DIAGNOSIS — Z01818 Encounter for other preprocedural examination: Secondary | ICD-10-CM

## 2022-10-22 DIAGNOSIS — I10 Essential (primary) hypertension: Secondary | ICD-10-CM

## 2022-10-22 DIAGNOSIS — E782 Mixed hyperlipidemia: Secondary | ICD-10-CM

## 2022-10-23 ENCOUNTER — Other Ambulatory Visit: Payer: Medicare PPO

## 2022-10-26 ENCOUNTER — Ambulatory Visit
Admission: RE | Admit: 2022-10-26 | Discharge: 2022-10-26 | Disposition: A | Discharge status: Home / Self Care | Payer: Self-pay | Source: Ambulatory Visit | Attending: Internal Medicine | Admitting: Internal Medicine

## 2022-10-26 DIAGNOSIS — I10 Essential (primary) hypertension: Secondary | ICD-10-CM

## 2022-10-26 DIAGNOSIS — E782 Mixed hyperlipidemia: Secondary | ICD-10-CM

## 2022-10-26 DIAGNOSIS — Z01818 Encounter for other preprocedural examination: Secondary | ICD-10-CM

## 2022-10-28 ENCOUNTER — Other Ambulatory Visit: Payer: Self-pay | Admitting: Internal Medicine

## 2022-10-28 DIAGNOSIS — R931 Abnormal findings on diagnostic imaging of heart and coronary circulation: Secondary | ICD-10-CM

## 2022-11-30 ENCOUNTER — Telehealth (HOSPITAL_COMMUNITY): Payer: Self-pay | Admitting: *Deleted

## 2022-11-30 ENCOUNTER — Other Ambulatory Visit (HOSPITAL_COMMUNITY): Payer: Self-pay | Admitting: *Deleted

## 2022-11-30 MED ORDER — IVABRADINE HCL 5 MG PO TABS: ORAL_TABLET | ORAL | 0 refills | Status: AC

## 2022-11-30 NOTE — Telephone Encounter (Signed)
Patient returning call upcoming cardiac imaging study; pt verbalizes understanding of appt date/time, parking situation and where to check in, pre-test NPO status and medications ordered, and verified current allergies; name and call back number provided for further questions should they arise  Larey Brick RN Navigator Cardiac Imaging [facility] [phone] office [phone] cell  Patient to take 10mg  ivabradine if HR is greater than 65bpm.

## 2022-11-30 NOTE — Telephone Encounter (Signed)
Attempted to call patient regarding upcoming cardiac CT appointment. °Left message on voicemail with name and callback number ° °Lossie Kalp RN Navigator Cardiac Imaging °[facility] °[phone] Office °[phone] Cell ° °

## 2022-12-02 ENCOUNTER — Ambulatory Visit
Admission: RE | Admit: 2022-12-02 | Discharge: 2022-12-02 | Disposition: A | Discharge status: Home / Self Care | Payer: Medicare PPO | Source: Ambulatory Visit | Attending: Internal Medicine | Admitting: Internal Medicine

## 2022-12-02 ENCOUNTER — Other Ambulatory Visit: Payer: Self-pay | Admitting: Internal Medicine

## 2022-12-02 DIAGNOSIS — R931 Abnormal findings on diagnostic imaging of heart and coronary circulation: Secondary | ICD-10-CM | POA: Diagnosis present

## 2022-12-02 DIAGNOSIS — R000 Tachycardia, unspecified: Secondary | ICD-10-CM | POA: Insufficient documentation

## 2022-12-02 DIAGNOSIS — I700 Atherosclerosis of aorta: Secondary | ICD-10-CM | POA: Diagnosis not present

## 2022-12-02 LAB — POCT I-STAT CREATININE: Creatinine, Ser: 0.8 mg/dL (ref 0.44–1.00)

## 2022-12-02 MED ORDER — NITROGLYCERIN 0.4 MG SL SUBL
0.8000 mg | SUBLINGUAL_TABLET | Freq: Once | SUBLINGUAL | Status: AC
  Administered 2022-12-02: 0.4 mg via SUBLINGUAL
  Filled 2022-12-02: qty 25

## 2022-12-02 MED ORDER — IOHEXOL 350 MG/ML SOLN
100.0000 mL | Freq: Once | INTRAVENOUS | Status: AC | PRN
  Administered 2022-12-02: 100 mL via INTRAVENOUS

## 2022-12-02 MED ORDER — DILTIAZEM HCL 25 MG/5ML IV SOLN
10.0000 mg | Freq: Once | INTRAVENOUS | Status: AC
  Administered 2022-12-02: 10 mg via INTRAVENOUS
  Filled 2022-12-02: qty 5

## 2022-12-02 NOTE — Progress Notes (Signed)
Pt unable to relax for cardiac scan, provider made aware. Pt will need to reschedule scan. Pt educated on the need to take anxiety medication at home before coming for cardiac scan. Pt agreeable, husband at table side with pt. Pt stable on discharge.

## 2023-03-15 ENCOUNTER — Other Ambulatory Visit: Payer: Self-pay | Admitting: Family Medicine

## 2023-03-15 DIAGNOSIS — Z1231 Encounter for screening mammogram for malignant neoplasm of breast: Secondary | ICD-10-CM

## 2023-03-17 ENCOUNTER — Ambulatory Visit
Admission: RE | Admit: 2023-03-17 | Discharge: 2023-03-17 | Disposition: A | Discharge status: Home / Self Care | Payer: Medicare PPO | Source: Ambulatory Visit | Attending: Family Medicine | Admitting: Family Medicine

## 2023-03-17 DIAGNOSIS — Z1231 Encounter for screening mammogram for malignant neoplasm of breast: Secondary | ICD-10-CM | POA: Insufficient documentation

## 2023-09-28 ENCOUNTER — Other Ambulatory Visit: Payer: Self-pay

## 2023-09-28 ENCOUNTER — Encounter: Payer: Self-pay | Admitting: Emergency Medicine

## 2023-09-28 ENCOUNTER — Emergency Department
Admission: EM | Admit: 2023-09-28 | Discharge: 2023-09-28 | Disposition: A | Discharge status: Home / Self Care | Attending: Emergency Medicine | Admitting: Emergency Medicine

## 2023-09-28 DIAGNOSIS — K922 Gastrointestinal hemorrhage, unspecified: Secondary | ICD-10-CM | POA: Insufficient documentation

## 2023-09-28 DIAGNOSIS — R1031 Right lower quadrant pain: Secondary | ICD-10-CM | POA: Diagnosis present

## 2023-09-28 DIAGNOSIS — I10 Essential (primary) hypertension: Secondary | ICD-10-CM | POA: Insufficient documentation

## 2023-09-28 DIAGNOSIS — E119 Type 2 diabetes mellitus without complications: Secondary | ICD-10-CM | POA: Insufficient documentation

## 2023-09-28 LAB — COMPREHENSIVE METABOLIC PANEL WITH GFR
ALT: 18 U/L (ref 0–44)
AST: 19 U/L (ref 15–41)
Albumin: 3.9 g/dL (ref 3.5–5.0)
Alkaline Phosphatase: 59 U/L (ref 38–126)
Anion gap: 9 (ref 5–15)
BUN: 27 mg/dL — ABNORMAL HIGH (ref 8–23)
CO2: 25 mmol/L (ref 22–32)
Calcium: 9.5 mg/dL (ref 8.9–10.3)
Chloride: 101 mmol/L (ref 98–111)
Creatinine, Ser: 0.87 mg/dL (ref 0.44–1.00)
GFR, Estimated: >60 mL/min (ref >60)
Glucose, Bld: 171 mg/dL — ABNORMAL HIGH (ref 70–99)
Potassium: 3.7 mmol/L (ref 3.5–5.1)
Sodium: 135 mmol/L (ref 135–145)
Total Bilirubin: 1.2 mg/dL (ref 0.0–1.2)
Total Protein: 7.4 g/dL (ref 6.5–8.1)

## 2023-09-28 LAB — CBC
HCT: 39.5 % (ref 36.0–46.0)
Hemoglobin: 13 g/dL (ref 12.0–15.0)
MCH: 26.3 pg (ref 26.0–34.0)
MCHC: 32.9 g/dL (ref 30.0–36.0)
MCV: 79.8 fL — ABNORMAL LOW (ref 80.0–100.0)
Platelets: 320 10*3/uL (ref 150–400)
RBC: 4.95 MIL/uL (ref 3.87–5.11)
RDW: 14.4 % (ref 11.5–15.5)
WBC: 12.8 10*3/uL — ABNORMAL HIGH (ref 4.0–10.5)
nRBC: 0 % (ref 0.0–0.2)

## 2023-09-28 LAB — TYPE AND SCREEN
ABO/RH(D): O NEG
Antibody Screen: POSITIVE

## 2023-09-28 NOTE — ED Provider Notes (Signed)
 [facility] Provider Note   Event Date/Time   First MD Initiated Contact with Patient 09/28/23 1023     (approximate) History  Rectal Bleeding  HPI Victoria Bauer is a 73 y.o. female with a past medical history of type 2 diabetes, hypertension, hyperlipidemia, and anxiety who presents for 1 day of bright red blood per rectum.  Patient also endorses cramping bilateral lower quadrant abdominal pain that is mildly improved after defecation.  Patient does endorse hard stools over this time.  Patient states she has had a history of constipation and diverticulosis in the past.  Patient denies any orthostatic lightheadedness or dyspnea on exertion ROS: Patient currently denies any vision changes, tinnitus, difficulty speaking, facial droop, sore throat, chest pain, shortness of breath, abdominal pain, nausea/vomiting/diarrhea, dysuria, or weakness/numbness/paresthesias in any extremity   Physical Exam  Triage Vital Signs: ED Triage Vitals  Encounter Vitals Group     BP 09/28/23 0929 (!) 168/68     Systolic BP Percentile --      Diastolic BP Percentile --      Pulse Rate 09/28/23 0929 92     Resp 09/28/23 0929 17     Temp 09/28/23 0929 97.9 F (36.6 C)     Temp Source 09/28/23 0929 Oral     SpO2 09/28/23 0929 95 %     Weight 09/28/23 0930 135 lb (61.2 kg)     Height 09/28/23 0930 5\' 3"  (1.6 m)     Head Circumference --      Peak Flow --      Pain Score 09/28/23 0930 0     Pain Loc --      Pain Education --      Exclude from Growth Chart --    Most recent vital signs: Vitals:   09/28/23 0929  BP: (!) 168/68  Pulse: 92  Resp: 17  Temp: 97.9 F (36.6 C)  SpO2: 95%   General: Awake, oriented x4. CV:  Good peripheral perfusion.  Resp:  Normal effort.  Abd:  No distention.  Rectal:  Deferred per patient preference Other:  Elderly well-developed, well-nourished Caucasian female resting comfortably in no acute distress ED Results / Procedures / Treatments   Labs (all labs ordered are listed, but only abnormal results are displayed) Labs Reviewed  COMPREHENSIVE METABOLIC PANEL WITH GFR - Abnormal; Notable for the following components:      Result Value   Glucose, Bld 171 (*)    BUN 27 (*)    All other components within normal limits  CBC - Abnormal; Notable for the following components:   WBC 12.8 (*)    MCV 79.8 (*)    All other components within normal limits  POC OCCULT BLOOD, ED  TYPE AND SCREEN   PROCEDURES: Critical Care performed: No Procedures MEDICATIONS ORDERED IN ED: Medications - No data to display IMPRESSION / MDM / ASSESSMENT AND PLAN / ED COURSE  I reviewed the triage vital signs and the nursing notes.                             The patient is on the cardiac monitor to evaluate for evidence of arrhythmia and/or significant heart rate changes. Patient's presentation is most consistent with acute presentation with potential threat to life or bodily function. Given history and exam patient's presentation most consistent with Lower GI bleed possibly secondary to hemorrhoid or other nonemergent cause of bleeding. I have  low suspicion for Aortoenteric fistula, Upper GI Bleed, IBD, Mesenteric Ischemia, Rectal foreign body or ulcer.  Workup: CBC, BMP  Disposition: Discharge. Hemodynamically stable. SRP and prompt PCP follow up.   FINAL CLINICAL IMPRESSION(S) / ED DIAGNOSES   Final diagnoses:  Lower GI bleed   Rx / DC Orders   ED Discharge Orders     None      Note:  This document was prepared using Dragon voice recognition software and may include unintentional dictation errors.   Charleen Conn, MD 09/28/23 [phone]

## 2023-09-28 NOTE — Discharge Instructions (Addendum)
 Please hold your aspirin for the next 7 days or until your rectal bleeding has stopped

## 2023-09-28 NOTE — ED Triage Notes (Signed)
 Patient to ED via POV for rectal bleeding. Started last night. Bright red in color. Denies hx of same.

## 2023-10-13 ENCOUNTER — Encounter: Payer: Self-pay | Admitting: Emergency Medicine

## 2023-10-13 ENCOUNTER — Other Ambulatory Visit: Payer: Self-pay

## 2023-10-13 ENCOUNTER — Emergency Department
Admission: EM | Admit: 2023-10-13 | Discharge: 2023-10-13 | Disposition: A | Discharge status: Home / Self Care | Attending: Emergency Medicine | Admitting: Emergency Medicine

## 2023-10-13 DIAGNOSIS — I10 Essential (primary) hypertension: Secondary | ICD-10-CM | POA: Diagnosis present

## 2023-10-13 DIAGNOSIS — E871 Hypo-osmolality and hyponatremia: Secondary | ICD-10-CM | POA: Diagnosis not present

## 2023-10-13 DIAGNOSIS — E119 Type 2 diabetes mellitus without complications: Secondary | ICD-10-CM | POA: Insufficient documentation

## 2023-10-13 DIAGNOSIS — Z79899 Other long term (current) drug therapy: Secondary | ICD-10-CM | POA: Diagnosis not present

## 2023-10-13 LAB — CBC
HCT: 39.9 % (ref 36.0–46.0)
Hemoglobin: 12.8 g/dL (ref 12.0–15.0)
MCH: 25.6 pg — ABNORMAL LOW (ref 26.0–34.0)
MCHC: 32.1 g/dL (ref 30.0–36.0)
MCV: 79.8 fL — ABNORMAL LOW (ref 80.0–100.0)
Platelets: 335 10*3/uL (ref 150–400)
RBC: 5 MIL/uL (ref 3.87–5.11)
RDW: 14.9 % (ref 11.5–15.5)
WBC: 9.5 10*3/uL (ref 4.0–10.5)
nRBC: 0 % (ref 0.0–0.2)

## 2023-10-13 LAB — URINALYSIS, ROUTINE W REFLEX MICROSCOPIC
Bilirubin Urine: NEGATIVE
Glucose, UA: NEGATIVE mg/dL
Hgb urine dipstick: NEGATIVE
Ketones, ur: NEGATIVE mg/dL
Leukocytes,Ua: NEGATIVE
Nitrite: NEGATIVE
Protein, ur: NEGATIVE mg/dL
Specific Gravity, Urine: 1.009 (ref 1.005–1.030)
pH: 6 (ref 5.0–8.0)

## 2023-10-13 LAB — TSH: TSH: 3.271 u[IU]/mL (ref 0.350–4.500)

## 2023-10-13 LAB — T4, FREE: Free T4: 0.89 ng/dL (ref 0.61–1.12)

## 2023-10-13 LAB — BASIC METABOLIC PANEL WITH GFR
Anion gap: 10 (ref 5–15)
BUN: 20 mg/dL (ref 8–23)
CO2: 23 mmol/L (ref 22–32)
Calcium: 9.7 mg/dL (ref 8.9–10.3)
Chloride: 100 mmol/L (ref 98–111)
Creatinine, Ser: 0.76 mg/dL (ref 0.44–1.00)
GFR, Estimated: >60 mL/min (ref >60)
Glucose, Bld: 171 mg/dL — ABNORMAL HIGH (ref 70–99)
Potassium: 4.1 mmol/L (ref 3.5–5.1)
Sodium: 133 mmol/L — ABNORMAL LOW (ref 135–145)

## 2023-10-13 MED ORDER — METOPROLOL TARTRATE 25 MG PO TABS: 12.5000 mg | ORAL_TABLET | Freq: Two times a day (BID) | ORAL | 0 refills | Status: AC

## 2023-10-13 MED ORDER — LABETALOL HCL 5 MG/ML IV SOLN
10.0000 mg | Freq: Once | INTRAVENOUS | Status: AC
  Administered 2023-10-13: 10 mg via INTRAVENOUS
  Filled 2023-10-13: qty 4

## 2023-10-13 MED ORDER — ONDANSETRON HCL 4 MG/2ML IJ SOLN
4.0000 mg | Freq: Once | INTRAMUSCULAR | Status: AC
  Administered 2023-10-13: 4 mg via INTRAVENOUS
  Filled 2023-10-13: qty 2

## 2023-10-13 NOTE — ED Notes (Signed)
 Pt unhooked and ambulatory to bathroom with steady gait

## 2023-10-13 NOTE — ED Triage Notes (Signed)
 Pt to ED via POV. Pt states that her blood pressure was high this morning. Pt states that it was over 200 systolic. Pt is taking medication for HTN but states that they recently cut her dose in half. Pt states that today is the first day it has been this high. Pt also states that her blood sugar was elevated this morning, pt states that her CBG was 187 prior to taking her medication. Pt denies dizziness, weakness, or fatigue. Pt states that her eyes "feel funny". Pt BP in triage 222/97. Pt is in NAD. Pt denies chest pain.

## 2023-10-13 NOTE — Discharge Instructions (Signed)
 Resume taking your Lotrel blood pressure medication twice a day.  Start taking metoprolol 12.5mg  (half-tablet) twice a day as well.   Check your heart rate and blood pressure twice a day. If your heart rate is less than 60, stop taking metoprolol.

## 2023-10-13 NOTE — ED Provider Notes (Signed)
 [facility] Provider Note    Event Date/Time   First MD Initiated Contact with Patient 10/13/23 [phone]     (approximate)   History   Chief Complaint: Hypertension and Hyperglycemia   HPI  Victoria Bauer is a 73 y.o. female with a history of anxiety, diabetes, hypertension, hyperlipidemia, whitecoat syndrome who comes ED complaining of severe hypertension.  Fast heart rate.  Reports that over the last week she has noticed some mildly elevated blood pressures of 140-150 systolic.  Saw her doctor yesterday and they decided to keep monitoring blood pressure.  However, this morning she woke up feeling like her heart was racing and that there was "pressure" behind her eyes.  No acute headache or vision change.  No paresthesias or motor weakness.  No trauma.  She has been compliant with her medications which include amlodipine benazepril for hypertension.  Not on blood thinners due to issues with bradycardia in 2015 when she was on atenolol.  Does note that she and her husband had a viral illness about 4 weeks ago which has now resolved.  She has gained 15 pounds over the last year.  Denies any new changes with body temperature regulation.        Past Medical History:  Diagnosis Date   Anxiety    Diabetes mellitus without complication (HCC)    Hyperlipidemia    Hypertension     Current Outpatient Rx   Order #: [id number] Class: Normal   Order #: [id number] Class: Historical Med   Order #: [id number] Class: Historical Med   Order #: [id number] Class: Historical Med   Order #: [id number] Class: Print   Order #: [id number] Class: Historical Med   Order #: [id number] Class: Historical Med   Order #: [id number] Class: Print   Order #: [id number] Class: Historical Med   Order #: [id number] Class: Historical Med   Order #: [id number] Class: Historical Med   Order #: [id number] Class: Historical Med   Order #: [id number] Class: Normal   Order #: [id number] Class: Historical Med   Order #:  [id number] Class: Historical Med   Order #: [id number] Class: Historical Med   Order #: [id number] Class: Historical Med    Past Surgical History:  Procedure Laterality Date   ABDOMINAL HYSTERECTOMY     COLONOSCOPY     COLONOSCOPY WITH PROPOFOL  N/A 09/21/2019   Procedure: COLONOSCOPY WITH PROPOFOL ;  Surgeon: Toledo, Alphonsus Jeans, MD;  Location: [facility] ENDOSCOPY;  Service: Gastroenterology;  Laterality: N/A;   DILATION AND CURETTAGE OF UTERUS      Physical Exam   Triage Vital Signs: ED Triage Vitals  Encounter Vitals Group     BP 10/13/23 0811 (!) 222/97     Systolic BP Percentile --      Diastolic BP Percentile --      Pulse Rate 10/13/23 0811 (!) 117     Resp 10/13/23 0811 16     Temp 10/13/23 0811 98.1 F (36.7 C)     Temp Source 10/13/23 0811 Oral     SpO2 10/13/23 0811 97 %     Weight 10/13/23 0812 150 lb (68 kg)     Height 10/13/23 0812 5\' 3"  (1.6 m)     Head Circumference --      Peak Flow --      Pain Score 10/13/23 0812 0     Pain Loc --      Pain Education --      Exclude from Growth Chart --     Most recent vital signs: Vitals:   10/13/23 0900 10/13/23 0930  BP: (!) 181/79 (!) 147/62  Pulse: 85 75  Resp: (!) 21 (!) 24  Temp:    SpO2: 100% 96%    General: Awake, no distress.  CV:  Good peripheral perfusion.  Tachycardia heart rate 110, normal distal pulses Resp:  Normal effort.  Clear to auscultation bilaterally Abd:  No distention.  Soft nontender Other:  No lower extremity edema.  No goiter   ED Results / Procedures / Treatments   Labs (all labs ordered are listed, but only abnormal results are displayed) Labs Reviewed  BASIC METABOLIC PANEL WITH GFR - Abnormal; Notable for the following components:      Result Value   Sodium 133 (*)    Glucose, Bld 171 (*)    All other components within normal limits  CBC - Abnormal; Notable for the following components:   MCV 79.8 (*)    MCH 25.6 (*)    All other components within normal limits  URINALYSIS,  ROUTINE W REFLEX MICROSCOPIC - Abnormal; Notable for the following components:   Color, Urine STRAW (*)    APPearance CLEAR (*)    All other components within normal limits  T4, FREE  TSH     EKG Interpreted by me Sinus rhythm rate of 79.  Normal axis, normal intervals.  Normal QRS ST segments and T waves.  No acute ischemic changes.   RADIOLOGY    PROCEDURES:  Procedures   MEDICATIONS ORDERED IN ED: Medications  labetalol (NORMODYNE) injection 10 mg (10 mg Intravenous Given 10/13/23 0916)  ondansetron  (ZOFRAN ) injection 4 mg (4 mg Intravenous Given 10/13/23 1004)     IMPRESSION / MDM / ASSESSMENT AND PLAN / ED COURSE  I reviewed the triage vital signs and the nursing notes.  DDx: Electrolyte derangement, anemia, arrhythmia, UTI, hyperthyroidism, anxiety  Patient's presentation is most consistent with acute presentation with potential threat to life or bodily function.     Clinical Course as of 10/13/23 1034  Wed Oct 13, 2023  [id number] Patient with a history of hypertension diabetes hyperlipidemia who comes ED complaining of severe hypertension as well as tachycardia this morning. [PS]    Clinical Course User Index [PS] Jacquie Maudlin, MD     ----------------------------------------- 10:34 AM on 10/13/2023 ----------------------------------------- Workup unremarkable.  After IV labetalol, heart rate is 75 blood pressure is 150/60.  She feels better.  Recommended she resume taking her Lotrel twice a day as had been her previous regimen before it was decreased to just a morning dose.  Will add on low-dose metoprolol as well.  Doubt ACS PE dissection or pericardial effusion.  FINAL CLINICAL IMPRESSION(S) / ED DIAGNOSES   Final diagnoses:  Uncontrolled hypertension  Type 2 diabetes mellitus without complication, without long-term current use of insulin (HCC)     Rx / DC Orders   ED Discharge Orders          Ordered    metoprolol tartrate (LOPRESSOR) 25 MG  tablet  2 times daily        10/13/23 1033             Note:  This document was prepared using Dragon voice recognition software and may include unintentional dictation errors.   Jacquie Maudlin, MD 10/13/23 1036

## 2024-02-22 ENCOUNTER — Other Ambulatory Visit: Payer: Self-pay | Admitting: Family Medicine

## 2024-02-22 DIAGNOSIS — Z1231 Encounter for screening mammogram for malignant neoplasm of breast: Secondary | ICD-10-CM

## 2024-04-13 ENCOUNTER — Ambulatory Visit
Admission: RE | Admit: 2024-04-13 | Discharge: 2024-04-13 | Disposition: A | Discharge status: Home / Self Care | Source: Ambulatory Visit | Attending: Family Medicine | Admitting: Family Medicine

## 2024-04-13 DIAGNOSIS — Z1231 Encounter for screening mammogram for malignant neoplasm of breast: Secondary | ICD-10-CM | POA: Diagnosis present

## 2024-04-18 ENCOUNTER — Other Ambulatory Visit: Payer: Self-pay | Admitting: Family Medicine

## 2024-04-18 DIAGNOSIS — N6489 Other specified disorders of breast: Secondary | ICD-10-CM

## 2024-04-18 DIAGNOSIS — R928 Other abnormal and inconclusive findings on diagnostic imaging of breast: Secondary | ICD-10-CM

## 2024-04-20 ENCOUNTER — Other Ambulatory Visit

## 2024-04-20 ENCOUNTER — Encounter

## 2024-04-20 ENCOUNTER — Ambulatory Visit
Admission: RE | Admit: 2024-04-20 | Discharge: 2024-04-20 | Disposition: A | Discharge status: Home / Self Care | Source: Ambulatory Visit | Attending: Family Medicine | Admitting: Family Medicine

## 2024-04-20 DIAGNOSIS — N6489 Other specified disorders of breast: Secondary | ICD-10-CM | POA: Insufficient documentation

## 2024-04-20 DIAGNOSIS — R928 Other abnormal and inconclusive findings on diagnostic imaging of breast: Secondary | ICD-10-CM | POA: Diagnosis present
